# Patient Record
Sex: Male | Born: 1985 | Race: Black or African American | Hispanic: No | Marital: Married | Smoking: Current every day smoker
Health system: Southern US, Community
[De-identification: ages and names within clinical notes are randomized; demographics above are authoritative.]

---
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
## 2003-02-16 IMAGING — CT CT PARANASAL SINUSES LIMITED
1 series · 10 of 12 positions shown, 13 images · non-contrast
Comparison: none

FINDINGS
CLINICAL DATA: CHRONIC SINUSITIS.
CT SINUS LTD WITHOUT CONTRAST MEDIA
4 MM COLLIMATION CORONAL NON-CONTRAST SURVEY IMAGES OF THE PARANASAL SINUSES ARE OBTAINED AT 10 MM
SPACING.  MILD BOWING OF NASAL SEPTUM TO THE RIGHT.  PARANASAL SINUSES CLEAR.  NO FLUID,
SIGNIFICANT MUCOSAL THICKENING OR OPACIFICATION SEEN.  NO FRACTURE OR BONE DESTRUCTION.
IMPRESSION
SLIGHT NASAL SEPTAL BOWING.  NO SINUS OPACIFICATION.

[Series 9465: — · axial · 0.30mm/px · z∈[-597,-507]mm · 10 of 12 slices shown, 13 images]
[im 2/12  brain]
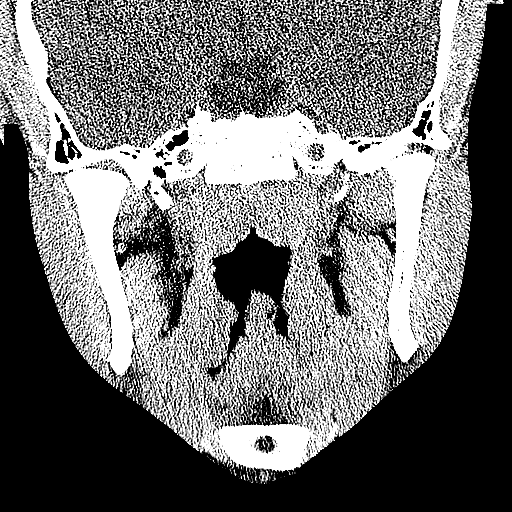
[im 2/12  bone]
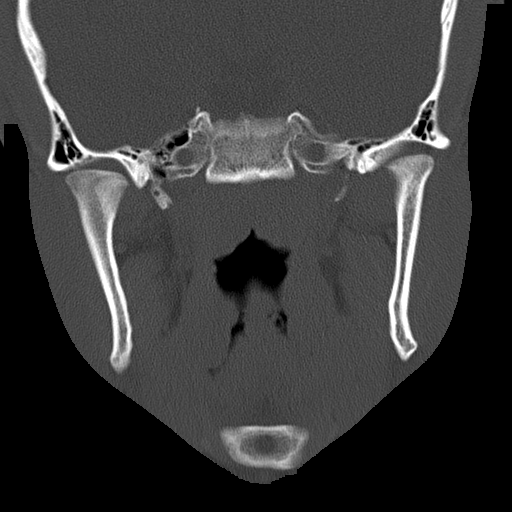
[im 3/12  bone]
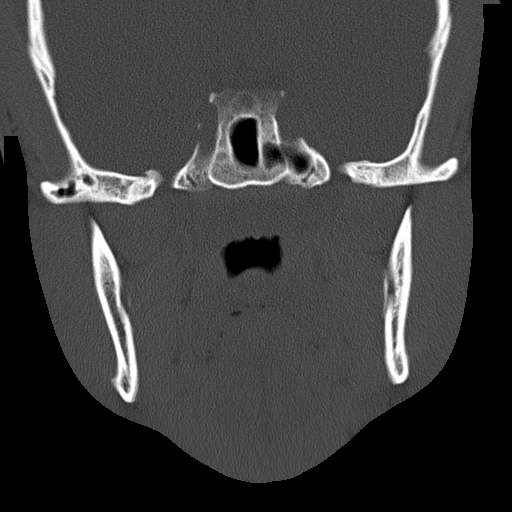
[im 4/12  bone]
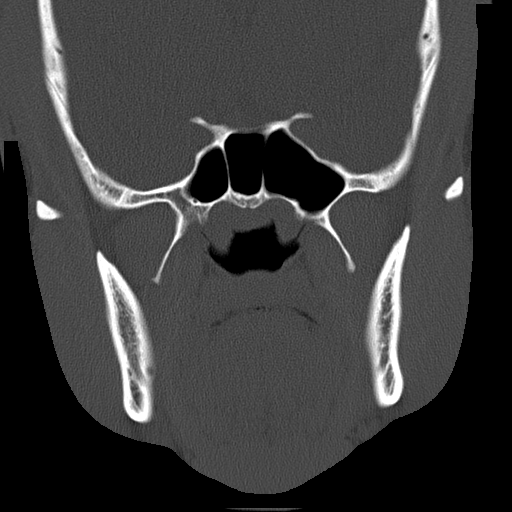
[im 5/12  bone]
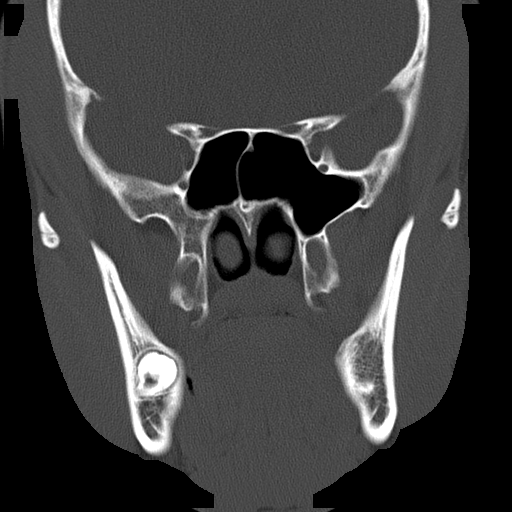
[im 6/12  brain]
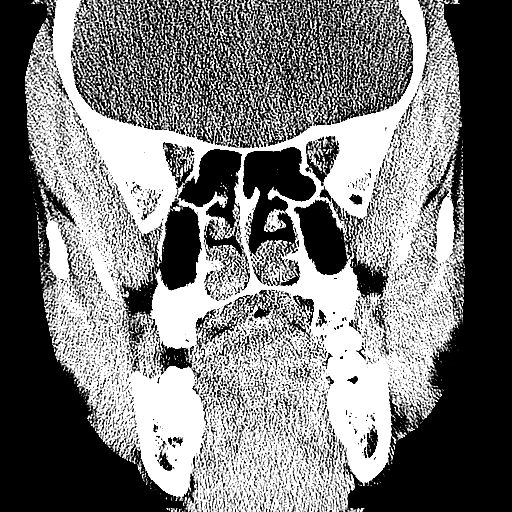
[im 6/12  bone]
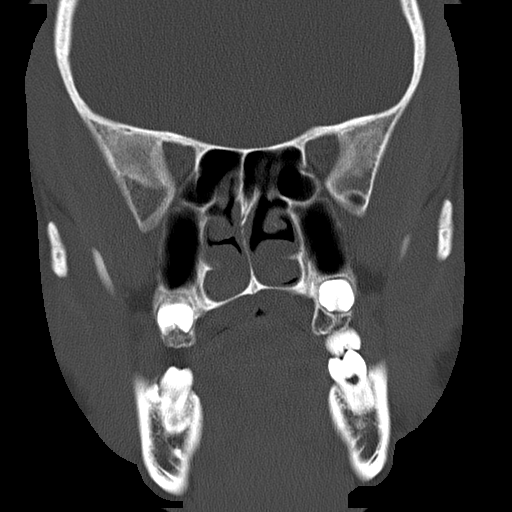
[im 7/12  bone]
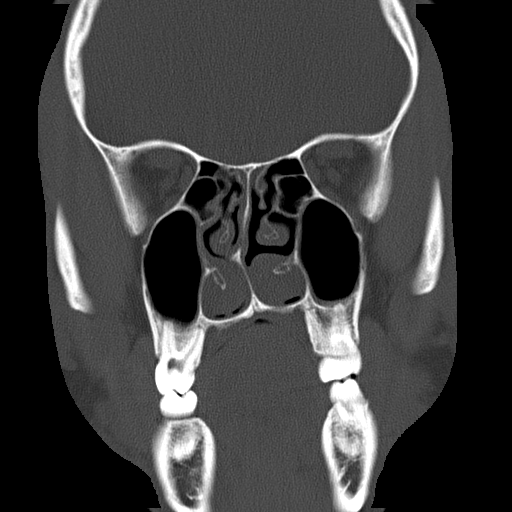
[im 8/12  bone]
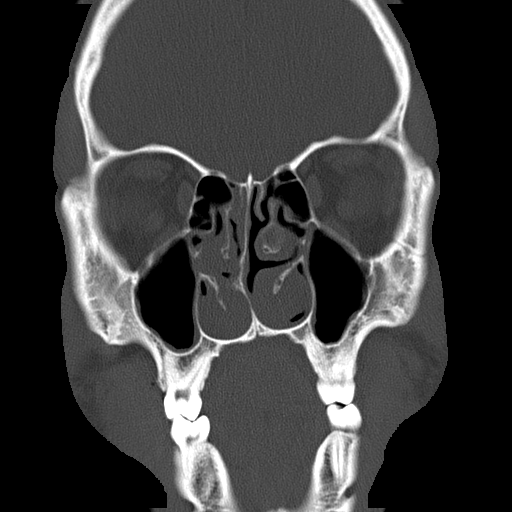
[im 9/12  bone]
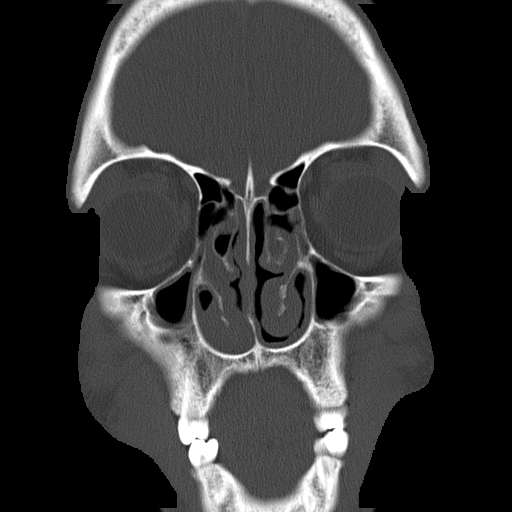
[im 10/12  brain]
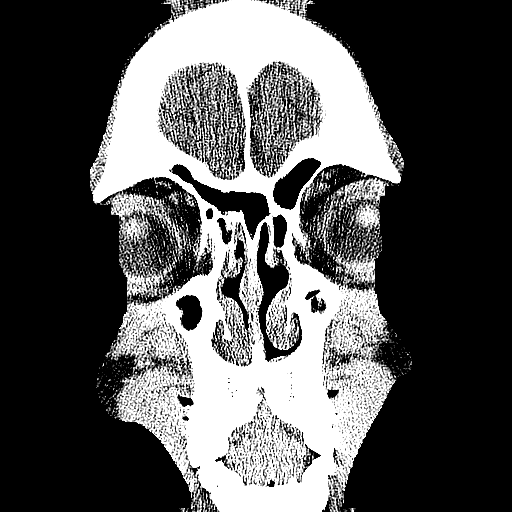
[im 10/12  bone]
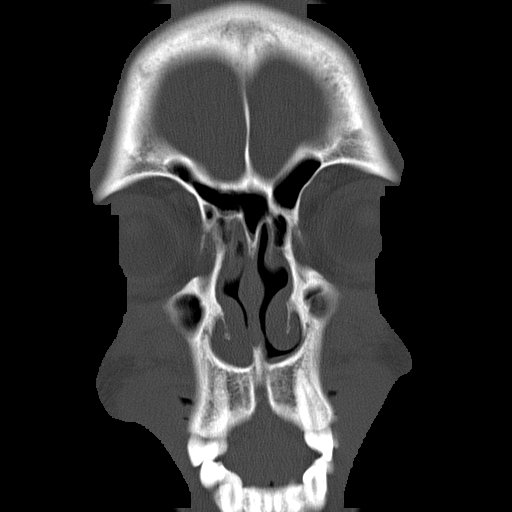
[im 11/12  bone]
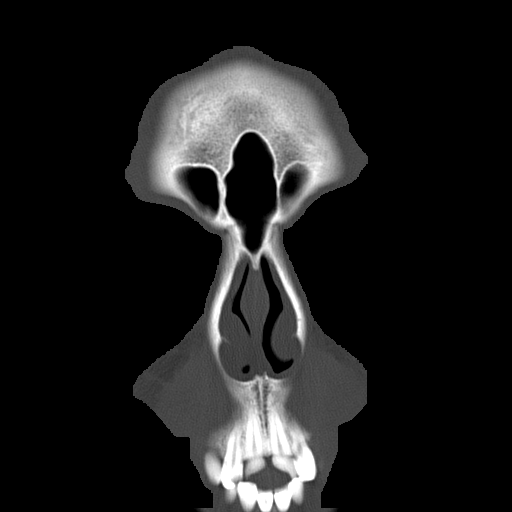

[10 of 12 positions shown; findings below may reference images not displayed]

## 2009-02-04 ENCOUNTER — Emergency Department (HOSPITAL_COMMUNITY): Admission: EM | Admit: 2009-02-04 | Discharge: 2009-02-05 | Payer: Self-pay | Admitting: Emergency Medicine

## 2009-11-19 IMAGING — CR DG CHEST 2V
2 series · 2 of 2 positions shown · non-contrast
Comparison: None

CLINICAL DATA: Chest pain, weakness, smoker

CHEST - 2 VIEW

[w chest pa]
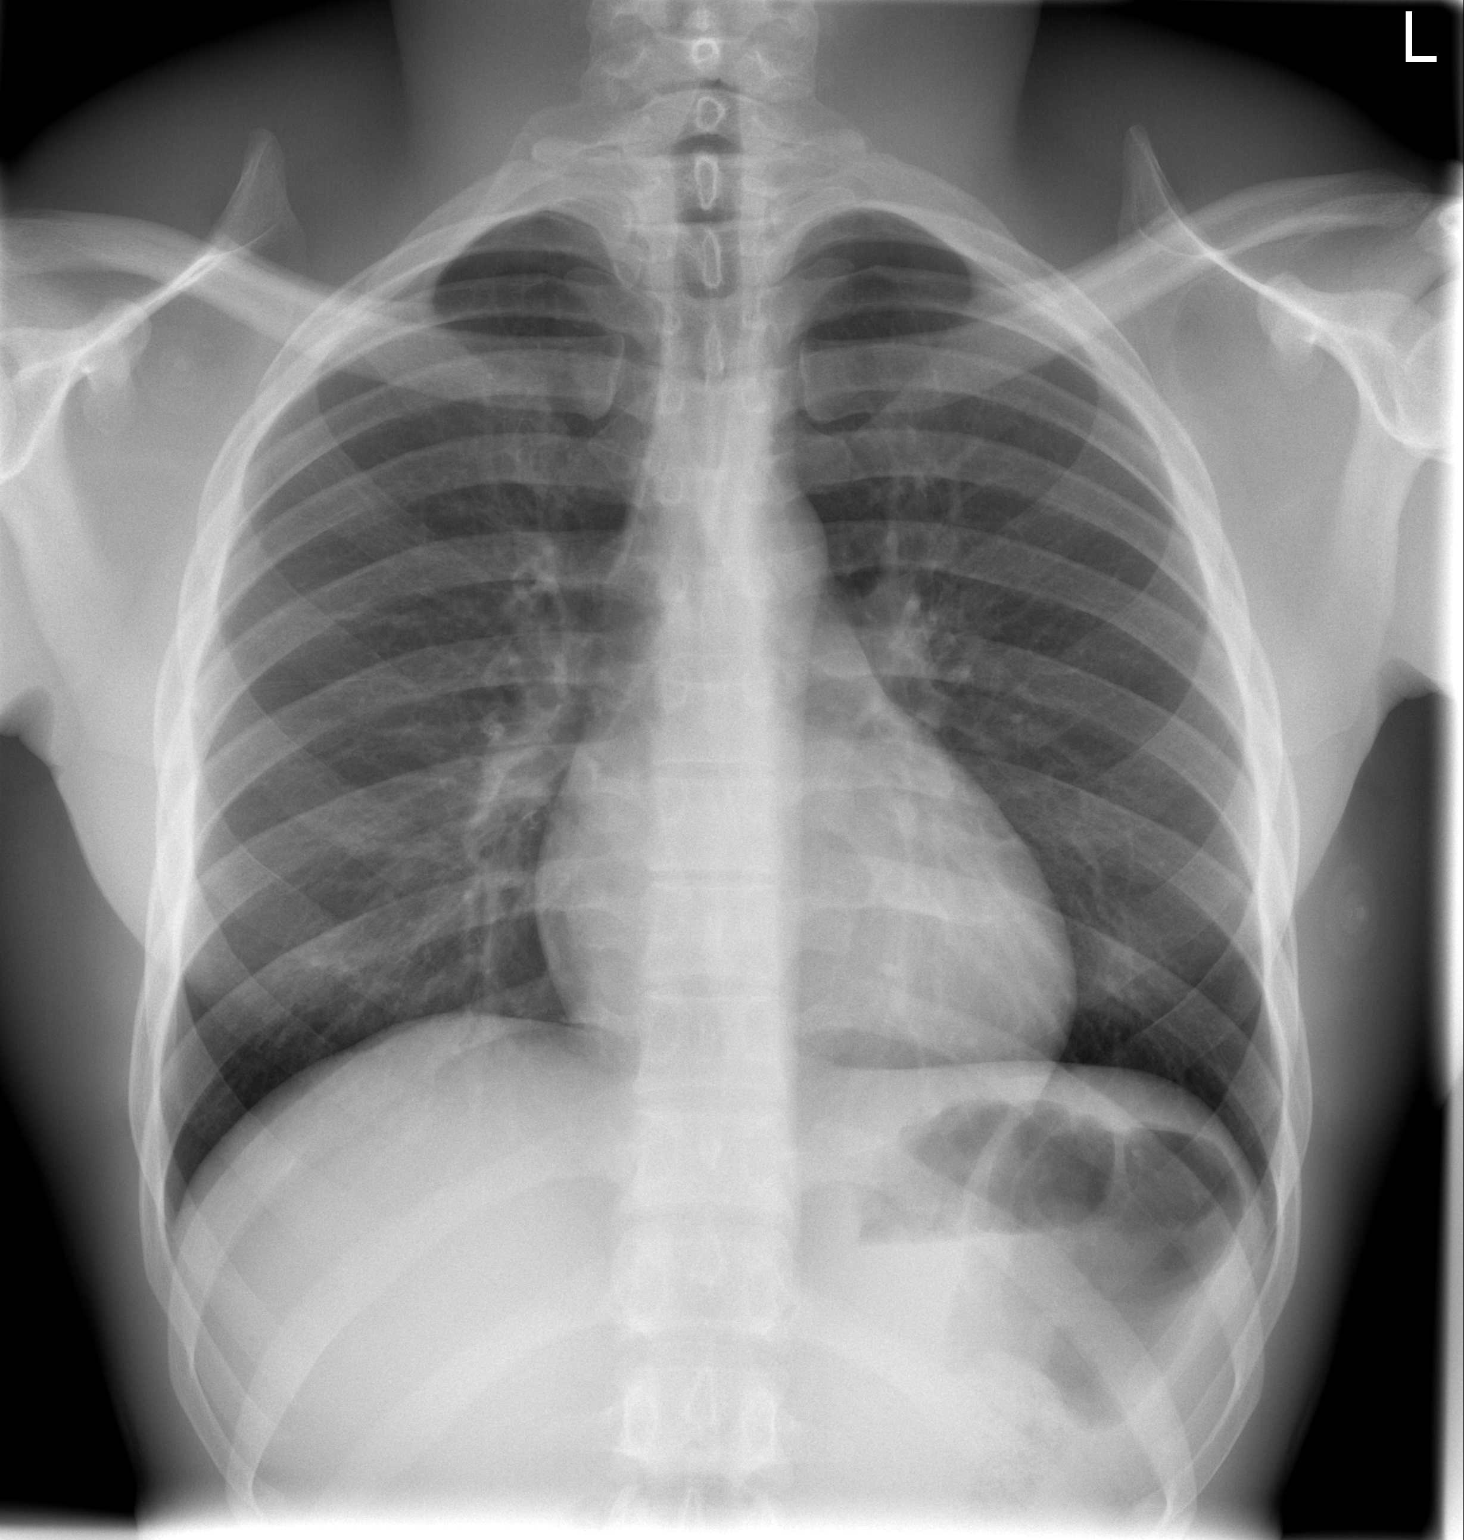

[w chest lat]
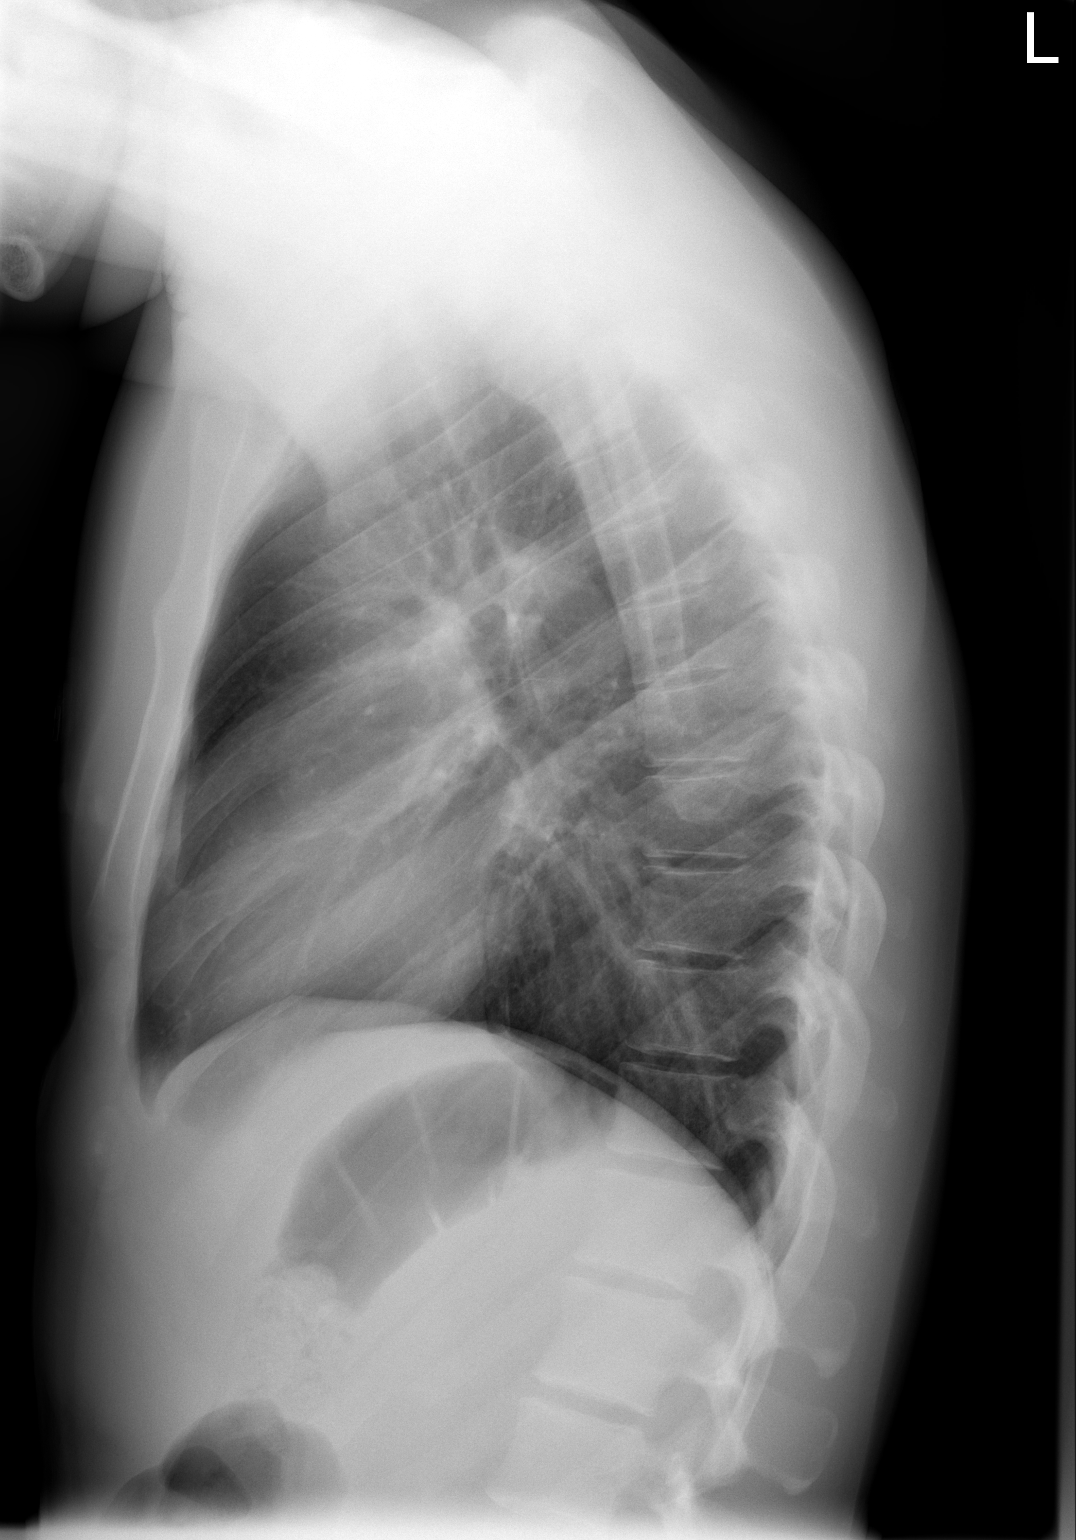

[2 of 2 positions shown; findings below may reference images not displayed]

FINDINGS: Normal heart size, mediastinal contours, and pulmonary vascularity.
Lungs clear.
Bones unremarkable.
No pneumothorax.
IMPRESSION: No acute abnormalities.

## 2011-03-02 NOTE — Procedures (Signed)
[facility]  Patient:    Bobby Hull, Bobby Hull Visit Number: [id number] MRN: [id number]          Service Type: Attending:  Kari Baars, M.D. Dictated by:   Kari Baars, M.D.                      Pulmonary Function Test Inter.  IMPRESSION 1. Spirometry is normal. Dictated by:   Kari Baars, M.D. Attending:  Kari Baars, M.D. DD:  11/07/01 TD:  11/09/01 Job: [id number]

## 2013-11-23 ENCOUNTER — Ambulatory Visit (INDEPENDENT_AMBULATORY_CARE_PROVIDER_SITE_OTHER): Payer: Managed Care, Other (non HMO) | Admitting: Nurse Practitioner

## 2013-11-23 ENCOUNTER — Encounter: Payer: Self-pay | Admitting: Nurse Practitioner

## 2013-11-23 VITALS — BP 110/66 | HR 76 | Temp 97.2°F | Ht 64.0 in | Wt 146.0 lb

## 2013-11-23 DIAGNOSIS — F902 Attention-deficit hyperactivity disorder, combined type: Secondary | ICD-10-CM

## 2013-11-23 DIAGNOSIS — F909 Attention-deficit hyperactivity disorder, unspecified type: Secondary | ICD-10-CM

## 2013-11-23 MED ORDER — LISDEXAMFETAMINE DIMESYLATE 30 MG PO CAPS: 30.0000 mg | ORAL_CAPSULE | ORAL | Status: DC

## 2013-11-23 NOTE — Progress Notes (Signed)
   Subjective:    Patient ID: Bobby Hull, male    DOB: 23-Apr-1986, 28 y.o.   MRN: [id number]  HPI Patient was scheduled for annual physical today  But that is not what he is actually here for. PAtient in today C/o trouble concentrating at work- Says that he cannot stay focused- He is on production based at work and since can't concentrate he is not making as much money- Supervisor told him he needed to pick up his performance or he may loose his job. Says that looking back when he was in school he stayed in trouble for talking and had trouble staying focused to get work done- he learns easily so grades were good.    Review of Systems  Constitutional: Negative.   HENT: Negative.   Respiratory: Negative.   Musculoskeletal: Negative.   Psychiatric/Behavioral: Negative.   All other systems reviewed and are negative.       Objective:   Physical Exam  Constitutional: He appears well-developed and well-nourished.  Cardiovascular: Normal rate, regular rhythm and normal heart sounds.   Pulmonary/Chest: Effort normal and breath sounds normal.  Neurological: He is alert.  Psychiatric: He has a normal mood and affect. His behavior is normal. Judgment and thought content normal.   BP 110/66  Pulse 76  Temp(Src) 97.2 F (36.2 C) (Oral)  Ht 5\' 4"  (1.626 m)  Wt 146 lb (66.225 kg)  BMI 25.05 kg/m2        Assessment & Plan:   1. ADHD (attention deficit hyperactivity disorder), combined type    Meds ordered this encounter  Medications  . lisdexamfetamine (VYVANSE) 30 MG capsule    Sig: Take 1 capsule (30 mg total) by mouth every morning.    Dispense:  30 capsule    Refill:  0    Order Specific Question:  Supervising Provider    Answer:  Ernestina PennaMOORE, DONALD W [[id number]]   Stress management Follow up in 1 month Mary-Margaret Daphine DeutscherMartin, FNP

## 2013-11-23 NOTE — Patient Instructions (Signed)
Attention Deficit Hyperactivity Disorder Attention deficit hyperactivity disorder (ADHD) is a problem with behavior issues based on the way the brain functions (neurobehavioral disorder). It is a common reason for behavior and academic problems in school. SYMPTOMS  There are 3 types of ADHD. The 3 types and some of the symptoms include:  Inattentive  Gets bored or distracted easily.  Loses or forgets things. Forgets to hand in homework.  Has trouble organizing or completing tasks.  Difficulty staying on task.  An inability to organize daily tasks and school work.  Leaving projects, chores, or homework unfinished.  Trouble paying attention or responding to details. Careless mistakes.  Difficulty following directions. Often seems like is not listening.  Dislikes activities that require sustained attention (like chores or homework).  Hyperactive-impulsive  Feels like it is impossible to sit still or stay in a seat. Fidgeting with hands and feet.  Trouble waiting turn.  Talking too much or out of turn. Interruptive.  Speaks or acts impulsively.  Aggressive, disruptive behavior.  Constantly busy or on the go, noisy.  Often leaves seat when they are expected to remain seated.  Often runs or climbs where it is not appropriate, or feels very restless.  Combined  Has symptoms of both of the above. Often children with ADHD feel discouraged about themselves and with school. They often perform well below their abilities in school. As children get older, the excess motor activities can calm down, but the problems with paying attention and staying organized persist. Most children do not outgrow ADHD but with good treatment can learn to cope with the symptoms. DIAGNOSIS  When ADHD is suspected, the diagnosis should be made by professionals trained in ADHD. This professional will collect information about the individual suspected of having ADHD. Information must be collected from  various settings where the person lives, works, or attends school.  Diagnosis will include:  Confirming symptoms began in childhood.  Ruling out other reasons for the child's behavior.  The health care providers will check with the child's school and check their medical records.  They will talk to teachers and parents.  Behavior rating scales for the child will be filled out by those dealing with the child on a daily basis. A diagnosis is made only after all information has been considered. TREATMENT  Treatment usually includes behavioral treatment, tutoring or extra support in school, and stimulant medicines. Because of the way a person's brain works with ADHD, these medicines decrease impulsivity and hyperactivity and increase attention. This is different than how they would work in a person who does not have ADHD. Other medicines used include antidepressants and certain blood pressure medicines. Most experts agree that treatment for ADHD should address all aspects of the person's functioning. Along with medicines, treatment should include structured classroom management at school. Parents should reward good behavior, provide constant discipline, and limit-setting. Tutoring should be available for the child as needed. ADHD is a life-long condition. If untreated, the disorder can have long-term serious effects into adolescence and adulthood. HOME CARE INSTRUCTIONS   Often with ADHD there is a lot of frustration among family members dealing with the condition. Blame and anger are also feelings that are common. In many cases, because the problem affects the family as a whole, the entire family may need help. A therapist can help the family find better ways to handle the disruptive behaviors of the person with ADHD and promote change. If the person with ADHD is young, most of the therapist's work   is with the parents. Parents will learn techniques for coping with and improving their child's  behavior. Sometimes only the child with the ADHD needs counseling. Your health care providers can help you make these decisions.  Children with ADHD may need help learning how to organize. Some helpful tips include:  Keep routines the same every day from wake-up time to bedtime. Schedule all activities, including homework and playtime. Keep the schedule in a place where the person with ADHD will often see it. Mark schedule changes as far in advance as possible.  Schedule outdoor and indoor recreation.  Have a place for everything and keep everything in its place. This includes clothing, backpacks, and school supplies.  Encourage writing down assignments and bringing home needed books. Work with your child's teachers for assistance in organizing school work.  Offer your child a well-balanced diet. Breakfast that includes a balance of whole grains, protein and, fruits or vegetables is especially important for school performance. Children should avoid drinks with caffeine including:  Soft drinks.  Coffee.  Tea.  However, some older children (adolescents) may find these drinks helpful in improving their attention. Because it can also be common for adolescents with ADHD to become addicted to caffeine, talk with your health care provider about what is a safe amount of caffeine intake for your child.  Children with ADHD need consistent rules that they can understand and follow. If rules are followed, give small rewards. Children with ADHD often receive, and expect, criticism. Look for good behavior and praise it. Set realistic goals. Give clear instructions. Look for activities that can foster success and self-esteem. Make time for pleasant activities with your child. Give lots of affection.  Parents are their children's greatest advocates. Learn as much as possible about ADHD. This helps you become a stronger and better advocate for your child. It also helps you educate your child's teachers and  instructors if they feel inadequate in these areas. Parent support groups are often helpful. A national group with local chapters is called Children and Adults with Attention Deficit Hyperactivity Disorder (CHADD). SEEK MEDICAL CARE IF:  Your child has repeated muscle twitches, cough or speech outbursts.  Your child has sleep problems.  Your child has a marked loss of appetite.  Your child develops depression.  Your child has new or worsening behavioral problems.  Your child develops dizziness.  Your child has a racing heart.  Your child has stomach pains.  Your child develops headaches. SEEK IMMEDIATE MEDICAL CARE IF:  Your child has been diagnosed with depression or anxiety and the symptoms seem to be getting worse.  Your child has been depressed and suddenly appears to have increased energy or motivation.  You are worried that your child is having a bad reaction to a medication he or she is taking for ADHD. Document Released: 09/21/2002 Document Revised: 07/22/2013 Document Reviewed: 06/08/2013 ExitCare Patient Information 2014 [employer].  

## 2013-12-21 ENCOUNTER — Encounter: Payer: Self-pay | Admitting: Nurse Practitioner

## 2013-12-21 ENCOUNTER — Ambulatory Visit (INDEPENDENT_AMBULATORY_CARE_PROVIDER_SITE_OTHER): Payer: Managed Care, Other (non HMO) | Admitting: Nurse Practitioner

## 2013-12-21 VITALS — BP 104/67 | HR 87 | Temp 97.2°F | Ht 64.0 in | Wt 142.0 lb

## 2013-12-21 DIAGNOSIS — F909 Attention-deficit hyperactivity disorder, unspecified type: Secondary | ICD-10-CM | POA: Insufficient documentation

## 2013-12-21 DIAGNOSIS — F988 Other specified behavioral and emotional disorders with onset usually occurring in childhood and adolescence: Secondary | ICD-10-CM

## 2013-12-21 MED ORDER — METHYLPHENIDATE HCL ER (OSM) 54 MG PO TBCR: 54.0000 mg | EXTENDED_RELEASE_TABLET | ORAL | Status: DC

## 2013-12-21 NOTE — Patient Instructions (Signed)
Stress Management Stress is a state of physical or mental tension that often results from changes in your life or normal routine. Some common causes of stress are:  Death of a loved one.  Injuries or severe illnesses.  Getting fired or changing jobs.  Moving into a new home. Other causes may be:  Sexual problems.  Business or financial losses.  Taking on a large debt.  Regular conflict with someone at home or at work.  Constant tiredness from lack of sleep. It is not just bad things that are stressful. It may be stressful to:  Win the lottery.  Get married.  Buy a new car. The amount of stress that can be easily tolerated varies from person to person. Changes generally cause stress, regardless of the types of change. Too much stress can affect your health. It may lead to physical or emotional problems. Too little stress (boredom) may also become stressful. SUGGESTIONS TO REDUCE STRESS:  Talk things over with your family and friends. It often is helpful to share your concerns and worries. If you feel your problem is serious, you may want to get help from a professional counselor.  Consider your problems one at a time instead of lumping them all together. Trying to take care of everything at once may seem impossible. List all the things you need to do and then start with the most important one. Set a goal to accomplish 2 or 3 things each day. If you expect to do too many in a single day you will naturally fail, causing you to feel even more stressed.  Do not use alcohol or drugs to relieve stress. Although you may feel better for a short time, they do not remove the problems that caused the stress. They can also be habit forming.  Exercise regularly - at least 3 times per week. Physical exercise can help to relieve that "uptight" feeling and will relax you.  The shortest distance between despair and hope is often a good night's sleep.  Go to bed and get up on time allowing  yourself time for appointments without being rushed.  Take a short "time-out" period from any stressful situation that occurs during the day. Close your eyes and take some deep breaths. Starting with the muscles in your face, tense them, hold it for a few seconds, then relax. Repeat this with the muscles in your neck, shoulders, hand, stomach, back and legs.  Take good care of yourself. Eat a balanced diet and get plenty of rest.  Schedule time for having fun. Take a break from your daily routine to relax. HOME CARE INSTRUCTIONS   Call if you feel overwhelmed by your problems and feel you can no longer manage them on your own.  Return immediately if you feel like hurting yourself or someone else. Document Released: 03/27/2001 Document Revised: 12/24/2011 Document Reviewed: 05/26/2013 ExitCare Patient Information 2014 [employer].  

## 2013-12-21 NOTE — Progress Notes (Signed)
   Subjective:    Patient ID: Bobby Hull, male    DOB: Jun 06, 1986, 28 y.o.   MRN: [id number]  HPI Patient in today for ADD follow-up. He was diagnosed with this at last visit and was started on vyvanse 30 mg daily- He says he cannot tell a difference at all n his ability to concentrate Only affect he has now is trouble sleeping.     Review of Systems  Constitutional: Negative.   HENT: Negative.   Respiratory: Negative.   Cardiovascular: Negative.   Genitourinary: Negative.   Musculoskeletal: Negative.   Psychiatric/Behavioral: Negative.   All other systems reviewed and are negative.       Objective:   Physical Exam  Constitutional: He is oriented to person, place, and time. He appears well-developed and well-nourished.  Cardiovascular: Normal rate and normal heart sounds.   Pulmonary/Chest: Effort normal and breath sounds normal.  Neurological: He is alert and oriented to person, place, and time.  Psychiatric: He has a normal mood and affect. His behavior is normal. Judgment and thought content normal.   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 methylphenidate (CONCERTA) 54 MG CR tablet    Sig: Take 1 tablet (54 mg total) by mouth every morning.    Dispense:  30 tablet    Refill:  0    Order Specific Question:  Supervising Provider    Answer:  Ernestina PennaMOORE, DONALD W [[id number]]   Stopped vyvanse  Changed to concerta STress management follow up in 1 month  Mary-Margaret Daphine DeutscherMartin, [place]NP

## 2014-02-01 ENCOUNTER — Ambulatory Visit: Payer: Managed Care, Other (non HMO) | Admitting: Nurse Practitioner

## 2014-02-17 ENCOUNTER — Telehealth: Payer: Self-pay | Admitting: Nurse Practitioner

## 2014-02-17 DIAGNOSIS — F988 Other specified behavioral and emotional disorders with onset usually occurring in childhood and adolescence: Secondary | ICD-10-CM

## 2014-02-17 MED ORDER — METHYLPHENIDATE HCL ER (OSM) 54 MG PO TBCR: 54.0000 mg | EXTENDED_RELEASE_TABLET | ORAL | Status: DC

## 2014-02-17 NOTE — Telephone Encounter (Signed)
rx ready for pickup 

## 2014-02-18 NOTE — Telephone Encounter (Signed)
Patient aware to pick up at front.Marland Kitchen.Marland Kitchen..Marland Kitchen

## 2014-03-04 ENCOUNTER — Ambulatory Visit (INDEPENDENT_AMBULATORY_CARE_PROVIDER_SITE_OTHER): Payer: Managed Care, Other (non HMO) | Admitting: Nurse Practitioner

## 2014-03-04 ENCOUNTER — Encounter: Payer: Self-pay | Admitting: Nurse Practitioner

## 2014-03-04 VITALS — BP 105/67 | HR 67 | Temp 98.3°F | Ht 64.0 in | Wt 143.0 lb

## 2014-03-04 DIAGNOSIS — F909 Attention-deficit hyperactivity disorder, unspecified type: Secondary | ICD-10-CM

## 2014-03-04 MED ORDER — AMPHETAMINE-DEXTROAMPHET ER 30 MG PO CP24: 30.0000 mg | ORAL_CAPSULE | ORAL | Status: DC

## 2014-03-04 NOTE — Patient Instructions (Signed)
Attention Deficit Hyperactivity Disorder Attention deficit hyperactivity disorder (ADHD) is a problem with behavior issues based on the way the brain functions (neurobehavioral disorder). It is a common reason for behavior and academic problems in school. SYMPTOMS  There are 3 types of ADHD. The 3 types and some of the symptoms include:  Inattentive  Gets bored or distracted easily.  Loses or forgets things. Forgets to hand in homework.  Has trouble organizing or completing tasks.  Difficulty staying on task.  An inability to organize daily tasks and school work.  Leaving projects, chores, or homework unfinished.  Trouble paying attention or responding to details. Careless mistakes.  Difficulty following directions. Often seems like is not listening.  Dislikes activities that require sustained attention (like chores or homework).  Hyperactive-impulsive  Feels like it is impossible to sit still or stay in a seat. Fidgeting with hands and feet.  Trouble waiting turn.  Talking too much or out of turn. Interruptive.  Speaks or acts impulsively.  Aggressive, disruptive behavior.  Constantly busy or on the go, noisy.  Often leaves seat when they are expected to remain seated.  Often runs or climbs where it is not appropriate, or feels very restless.  Combined  Has symptoms of both of the above. Often children with ADHD feel discouraged about themselves and with school. They often perform well below their abilities in school. As children get older, the excess motor activities can calm down, but the problems with paying attention and staying organized persist. Most children do not outgrow ADHD but with good treatment can learn to cope with the symptoms. DIAGNOSIS  When ADHD is suspected, the diagnosis should be made by professionals trained in ADHD. This professional will collect information about the individual suspected of having ADHD. Information must be collected from  various settings where the person lives, works, or attends school.  Diagnosis will include:  Confirming symptoms began in childhood.  Ruling out other reasons for the child's behavior.  The health care providers will check with the child's school and check their medical records.  They will talk to teachers and parents.  Behavior rating scales for the child will be filled out by those dealing with the child on a daily basis. A diagnosis is made only after all information has been considered. TREATMENT  Treatment usually includes behavioral treatment, tutoring or extra support in school, and stimulant medicines. Because of the way a person's brain works with ADHD, these medicines decrease impulsivity and hyperactivity and increase attention. This is different than how they would work in a person who does not have ADHD. Other medicines used include antidepressants and certain blood pressure medicines. Most experts agree that treatment for ADHD should address all aspects of the person's functioning. Along with medicines, treatment should include structured classroom management at school. Parents should reward good behavior, provide constant discipline, and limit-setting. Tutoring should be available for the child as needed. ADHD is a life-long condition. If untreated, the disorder can have long-term serious effects into adolescence and adulthood. HOME CARE INSTRUCTIONS   Often with ADHD there is a lot of frustration among family members dealing with the condition. Blame and anger are also feelings that are common. In many cases, because the problem affects the family as a whole, the entire family may need help. A therapist can help the family find better ways to handle the disruptive behaviors of the person with ADHD and promote change. If the person with ADHD is young, most of the therapist's work   is with the parents. Parents will learn techniques for coping with and improving their child's  behavior. Sometimes only the child with the ADHD needs counseling. Your health care providers can help you make these decisions.  Children with ADHD may need help learning how to organize. Some helpful tips include:  Keep routines the same every day from wake-up time to bedtime. Schedule all activities, including homework and playtime. Keep the schedule in a place where the person with ADHD will often see it. Mark schedule changes as far in advance as possible.  Schedule outdoor and indoor recreation.  Have a place for everything and keep everything in its place. This includes clothing, backpacks, and school supplies.  Encourage writing down assignments and bringing home needed books. Work with your child's teachers for assistance in organizing school work.  Offer your child a well-balanced diet. Breakfast that includes a balance of whole grains, protein and, fruits or vegetables is especially important for school performance. Children should avoid drinks with caffeine including:  Soft drinks.  Coffee.  Tea.  However, some older children (adolescents) may find these drinks helpful in improving their attention. Because it can also be common for adolescents with ADHD to become addicted to caffeine, talk with your health care provider about what is a safe amount of caffeine intake for your child.  Children with ADHD need consistent rules that they can understand and follow. If rules are followed, give small rewards. Children with ADHD often receive, and expect, criticism. Look for good behavior and praise it. Set realistic goals. Give clear instructions. Look for activities that can foster success and self-esteem. Make time for pleasant activities with your child. Give lots of affection.  Parents are their children's greatest advocates. Learn as much as possible about ADHD. This helps you become a stronger and better advocate for your child. It also helps you educate your child's teachers and  instructors if they feel inadequate in these areas. Parent support groups are often helpful. A national group with local chapters is called Children and Adults with Attention Deficit Hyperactivity Disorder (CHADD). SEEK MEDICAL CARE IF:  Your child has repeated muscle twitches, cough or speech outbursts.  Your child has sleep problems.  Your child has a marked loss of appetite.  Your child develops depression.  Your child has new or worsening behavioral problems.  Your child develops dizziness.  Your child has a racing heart.  Your child has stomach pains.  Your child develops headaches. SEEK IMMEDIATE MEDICAL CARE IF:  Your child has been diagnosed with depression or anxiety and the symptoms seem to be getting worse.  Your child has been depressed and suddenly appears to have increased energy or motivation.  You are worried that your child is having a bad reaction to a medication he or she is taking for ADHD. Document Released: 09/21/2002 Document Revised: 07/22/2013 Document Reviewed: 06/08/2013 ExitCare Patient Information 2014 [employer].  

## 2014-03-04 NOTE — Progress Notes (Signed)
   Subjective:    Patient ID: Bobby Hull, male    DOB: [birth year], 28 y.o.   MRN: [id number]  HPI Patient in today for follow up of adult ADHd- He is currently on concerta which works well for his concentration but causes him to be nauseated all the time- Was on vyvanse in the past but that did not help.    Review of Systems  Constitutional: Negative.   HENT: Negative.   Respiratory: Negative.   Cardiovascular: Negative.   Genitourinary: Negative.   Hematological: Negative.   Psychiatric/Behavioral: Negative.   All other systems reviewed and are negative.      Objective:   Physical Exam  Constitutional: He is oriented to person, place, and time. He appears well-developed and well-nourished.  Cardiovascular: Normal rate, regular rhythm and normal heart sounds.   Pulmonary/Chest: Effort normal and breath sounds normal.  Neurological: He is alert and oriented to person, place, and time.  Skin: Skin is warm and dry.  Psychiatric: He has a normal mood and affect. His behavior is normal. Judgment and thought content normal.    BP 105/67  Pulse 67  Temp(Src) 98.3 F (36.8 C) (Oral)  Ht 5\' 4"  (1.626 m)  Wt 143 lb (64.864 kg)  BMI 24.53 kg/m2       Assessment & Plan:   1. Adult ADHD    Meds ordered this encounter  Medications  . amphetamine-dextroamphetamine (ADDERALL XR) 30 MG 24 hr capsule    Sig: Take 1 capsule (30 mg total) by mouth every morning.    Dispense:  30 capsule    Refill:  0    Order Specific Question:  Supervising Provider    Answer:  Ernestina PennaMOORE, DONALD W [[id number]]   Stress management Follow up in 1 month  Mary-Margaret Daphine DeutscherMartin, FNP

## 2014-04-01 ENCOUNTER — Telehealth: Payer: Self-pay | Admitting: Nurse Practitioner

## 2014-04-01 MED ORDER — AMPHETAMINE-DEXTROAMPHET ER 30 MG PO CP24: 30.0000 mg | ORAL_CAPSULE | ORAL | Status: DC

## 2014-04-01 NOTE — Telephone Encounter (Signed)
rx ready for pickup 

## 2014-04-01 NOTE — Telephone Encounter (Signed)
Script is available to be picked up at front desk with photo ID. I was unable to reach patient by phone and a voicemail was not available.

## 2014-04-02 MED ORDER — AMPHETAMINE-DEXTROAMPHET ER 30 MG PO CP24: 30.0000 mg | ORAL_CAPSULE | ORAL | Status: DC

## 2014-04-02 NOTE — Telephone Encounter (Signed)
rx ready for pickup 

## 2014-05-21 ENCOUNTER — Telehealth: Payer: Self-pay | Admitting: Nurse Practitioner

## 2014-05-21 MED ORDER — AMPHETAMINE-DEXTROAMPHET ER 30 MG PO CP24: 30.0000 mg | ORAL_CAPSULE | ORAL | Status: DC

## 2014-05-21 NOTE — Telephone Encounter (Signed)
rx ready for pickup 

## 2014-05-22 NOTE — Telephone Encounter (Signed)
Patient aware to pick up 

## 2015-07-01 ENCOUNTER — Ambulatory Visit (INDEPENDENT_AMBULATORY_CARE_PROVIDER_SITE_OTHER): Payer: Managed Care, Other (non HMO) | Admitting: Family

## 2015-07-01 ENCOUNTER — Encounter: Payer: Self-pay | Admitting: Family

## 2015-07-01 VITALS — BP 112/82 | HR 78 | Temp 97.6°F | Ht 64.0 in | Wt 148.6 lb

## 2015-07-01 DIAGNOSIS — J0100 Acute maxillary sinusitis, unspecified: Secondary | ICD-10-CM

## 2015-07-01 MED ORDER — AMOXICILLIN-POT CLAVULANATE 875-125 MG PO TABS: 1.0000 | ORAL_TABLET | Freq: Two times a day (BID) | ORAL | Status: DC

## 2015-07-01 NOTE — Progress Notes (Signed)
   Subjective:    Patient ID: Bobby Hull, male    DOB: Jan 28, 1986, 29 y.o.   MRN: [id number]  Sinus Problem This is a new problem. The current episode started in the past 7 days. The problem has been waxing and waning since onset. There has been no fever. His pain is at a severity of 7/10. The pain is moderate. Associated symptoms include congestion, coughing, ear pain, headaches, sinus pressure and sneezing. Pertinent negatives include no hoarse voice, shortness of breath or sore throat. Past treatments include oral decongestants. The treatment provided mild relief.      Review of Systems  Constitutional: Negative.   HENT: Positive for congestion, ear pain, sinus pressure and sneezing. Negative for hoarse voice and sore throat.   Respiratory: Positive for cough. Negative for shortness of breath.   Cardiovascular: Negative.   Gastrointestinal: Negative.   Endocrine: Negative.   Genitourinary: Negative.   Musculoskeletal: Negative.   Neurological: Positive for headaches.  Hematological: Negative.   Psychiatric/Behavioral: Negative.   All other systems reviewed and are negative.      Objective:   Physical Exam  Constitutional: He is oriented to person, place, and time. He appears well-developed and well-nourished. No distress.  HENT:  Head: Normocephalic.  Right Ear: External ear normal.  Left Ear: External ear normal. Tympanic membrane is erythematous. A middle ear effusion is present.  Nose: Right sinus exhibits maxillary sinus tenderness. Left sinus exhibits maxillary sinus tenderness.  Nasal passage erythemas with mild swelling  Oropharynx erythemas   Eyes: Pupils are equal, round, and reactive to light. Right eye exhibits no discharge. Left eye exhibits no discharge.  Neck: Normal range of motion. Neck supple. No thyromegaly present.  Cardiovascular: Normal rate, regular rhythm, normal heart sounds and intact distal pulses.   No murmur heard. Pulmonary/Chest: Effort  normal and breath sounds normal. No respiratory distress. He has no wheezes.  Abdominal: Soft. Bowel sounds are normal. He exhibits no distension. There is no tenderness.  Musculoskeletal: Normal range of motion. He exhibits no edema or tenderness.  Neurological: He is alert and oriented to person, place, and time. He has normal reflexes. No cranial nerve deficit.  Skin: Skin is warm and dry. No rash noted. No erythema.  Psychiatric: He has a normal mood and affect. His behavior is normal. Judgment and thought content normal.  Vitals reviewed.    BP 112/82 mmHg  Pulse 78  Temp(Src) 97.6 F (36.4 C) (Oral)  Ht  (1.626 m)  Wt 148 lb 9.6 oz (67.405 kg)  BMI 25.49 kg/m2      Assessment & Plan:  1. Acute maxillary sinusitis, recurrence not specified -- Take meds as prescribed - Use a cool mist humidifier  -Use saline nose sprays frequently -Saline irrigations of the nose can be very helpful if done frequently.  * 4X daily for 1 week*  * Use of a nettie pot can be helpful with this. Follow directions with this* -Force fluids -For any cough or congestion  Use plain Mucinex- regular strength or max strength is fine   * Children- consult with Pharmacist for dosing -For fever or aces or pains- take tylenol or ibuprofen appropriate for age and weight.  * for fevers greater than 101 orally you may alternate ibuprofen and tylenol every  3 hours. -Throat lozenges if help - amoxicillin-clavulanate (AUGMENTIN) 875-125 MG per tablet; Take 1 tablet by mouth 2 (two) times daily.  Dispense: 14 tablet; Refill: 0  Jannifer Rodney, FNP

## 2015-07-01 NOTE — Patient Instructions (Signed)
Sinusitis Sinusitis is redness, soreness, and inflammation of the paranasal sinuses. Paranasal sinuses are air pockets within the bones of your face (beneath the eyes, the middle of the forehead, or above the eyes). In healthy paranasal sinuses, mucus is able to drain out, and air is able to circulate through them by way of your nose. However, when your paranasal sinuses are inflamed, mucus and air can become trapped. This can allow bacteria and other germs to grow and cause infection. Sinusitis can develop quickly and last only a short time (acute) or continue over a long period (chronic). Sinusitis that lasts for more than 12 weeks is considered chronic.  CAUSES  Causes of sinusitis include:  Allergies.  Structural abnormalities, such as displacement of the cartilage that separates your nostrils (deviated septum), which can decrease the air flow through your nose and sinuses and affect sinus drainage.  Functional abnormalities, such as when the small hairs (cilia) that line your sinuses and help remove mucus do not work properly or are not present. SIGNS AND SYMPTOMS  Symptoms of acute and chronic sinusitis are the same. The primary symptoms are pain and pressure around the affected sinuses. Other symptoms include:  Upper toothache.  Earache.  Headache.  Bad breath.  Decreased sense of smell and taste.  A cough, which worsens when you are lying flat.  Fatigue.  Fever.  Thick drainage from your nose, which often is green and may contain pus (purulent).  Swelling and warmth over the affected sinuses. DIAGNOSIS  Your health care provider will perform a physical exam. During the exam, your health care provider may:  Look in your nose for signs of abnormal growths in your nostrils (nasal polyps).  Tap over the affected sinus to check for signs of infection.  View the inside of your sinuses (endoscopy) using an imaging device that has a light attached (endoscope). If your health  care provider suspects that you have chronic sinusitis, one or more of the following tests may be recommended:  Allergy tests.  Nasal culture. A sample of mucus is taken from your nose, sent to a lab, and screened for bacteria.  Nasal cytology. A sample of mucus is taken from your nose and examined by your health care provider to determine if your sinusitis is related to an allergy. TREATMENT  Most cases of acute sinusitis are related to a viral infection and will resolve on their own within 10 days. Sometimes medicines are prescribed to help relieve symptoms (pain medicine, decongestants, nasal steroid sprays, or saline sprays).  However, for sinusitis related to a bacterial infection, your health care provider will prescribe antibiotic medicines. These are medicines that will help kill the bacteria causing the infection.  Rarely, sinusitis is caused by a fungal infection. In theses cases, your health care provider will prescribe antifungal medicine. For some cases of chronic sinusitis, surgery is needed. Generally, these are cases in which sinusitis recurs more than 3 times per year, despite other treatments. HOME CARE INSTRUCTIONS   Drink plenty of water. Water helps thin the mucus so your sinuses can drain more easily.  Use a humidifier.  Inhale steam 3 to 4 times a day (for example, sit in the bathroom with the shower running).  Apply a warm, moist washcloth to your face 3 to 4 times a day, or as directed by your health care provider.  Use saline nasal sprays to help moisten and clean your sinuses.  Take medicines only as directed by your health care provider.    If you were prescribed either an antibiotic or antifungal medicine, finish it all even if you start to feel better. SEEK IMMEDIATE MEDICAL CARE IF:  You have increasing pain or severe headaches.  You have nausea, vomiting, or drowsiness.  You have swelling around your face.  You have vision problems.  You have a stiff  neck.  You have difficulty breathing. MAKE SURE YOU:   Understand these instructions.  Will watch your condition.  Will get help right away if you are not doing well or get worse. Document Released: 10/01/2005 Document Revised: 02/15/2014 Document Reviewed: 10/16/2011 Exit[employer] Patient Information 2015 [employer]. This information is not intended to replace advice given to you by your health care provider. Make sure you discuss any questions you have with your health care provider.  - Take meds as prescribed - Use a cool mist humidifier  -Use saline nose sprays frequently -Saline irrigations of the nose can be very helpful if done frequently.  * 4X daily for 1 week*  * Use of a nettie pot can be helpful with this. Follow directions with this* -Force fluids -For any cough or congestion  Use plain Mucinex- regular strength or max strength is fine   * Children- consult with Pharmacist for dosing -For fever or aces or pains- take tylenol or ibuprofen appropriate for age and weight.  * for fevers greater than 101 orally you may alternate ibuprofen and tylenol every  3 hours. -Throat lozenges if help   Christy Hawks, FNP  

## 2015-12-14 ENCOUNTER — Telehealth: Payer: Self-pay | Admitting: Nurse Practitioner

## 2015-12-15 NOTE — Telephone Encounter (Signed)
Pt aware [facility] note written for 12/07/15

## 2015-12-15 NOTE — Telephone Encounter (Signed)
Ok to write patient note

## 2016-04-19 ENCOUNTER — Ambulatory Visit (INDEPENDENT_AMBULATORY_CARE_PROVIDER_SITE_OTHER): Payer: Managed Care, Other (non HMO) | Admitting: Pediatrics

## 2016-04-19 ENCOUNTER — Encounter: Payer: Self-pay | Admitting: Pediatrics

## 2016-04-19 ENCOUNTER — Telehealth: Payer: Self-pay | Admitting: Nurse Practitioner

## 2016-04-19 VITALS — BP 107/75 | HR 60 | Temp 97.9°F | Ht 64.0 in | Wt 152.4 lb

## 2016-04-19 DIAGNOSIS — S01511A Laceration without foreign body of lip, initial encounter: Secondary | ICD-10-CM

## 2016-04-19 DIAGNOSIS — L089 Local infection of the skin and subcutaneous tissue, unspecified: Secondary | ICD-10-CM

## 2016-04-19 MED ORDER — AMOXICILLIN-POT CLAVULANATE 875-125 MG PO TABS: 1.0000 | ORAL_TABLET | Freq: Two times a day (BID) | ORAL | Status: AC

## 2016-04-19 NOTE — Telephone Encounter (Signed)
Pt has lip lac from injury on 7/4 Now has redness appt scheduled

## 2016-04-19 NOTE — Progress Notes (Signed)
    Subjective:    Patient ID: Bobby Hull, male    DOB: August 30, 1986, 30 y.o.   MRN: [id number]  CC: Lip Laceration   HPI: Bobby Hull is a 30 y.o. male presenting for Lip Laceration  Cut lip when daughter's head banged into his lip while going down a slide Went to ED, had absorbable sutures placed Today noticed lip started smelling bad, has discharge from just inside the lip No fevers   Dep[facility] 04/19/2016 07/01/2015  Decreased Interest 0 0  Down, Depressed, Hopeless 0 0  PHQ - 2 Score 0 0     Relevant past medical, surgical, family and social history reviewed and updated as indicated.  Interim medical history since our last visit reviewed. Allergies and medications reviewed and updated.  ROS: Per HPI unless specifically indicated above  History  Smoking status  . Current Every Day Smoker  Smokeless tobacco  . Not on file       Objective:    BP 107/75 mmHg  Pulse 60  Temp(Src) 97.9 F (36.6 C) (Oral)  Ht 5\' 4"  (1.626 m)  Wt 152 lb 6.4 oz (69.128 kg)  BMI 26.15 kg/m2  SpO2 100%  Wt Readings from Last 3 Encounters:  04/19/16 152 lb 6.4 oz (69.128 kg)  07/01/15 148 lb 9.6 oz (67.405 kg)  03/04/14 143 lb (64.864 kg)     Gen: NAD, alert, cooperative with exam, NCAT EYES: EOMI, no scleral injection or icterus ENT:  OP without erythema Skin: R upper lip with two sutures in place. Scab over sutures. Inside lip with area of white with yellow discharge. Neuro: Alert and oriented     Assessment & Plan:    Bobby Hull was seen today for lip laceration infection.  Diagnoses and all orders for this visit:  Infected lip laceration, initial encounter -     amoxicillin-clavulanate (AUGMENTIN) 875-125 MG tablet; Take 1 tablet by mouth 2 (two) times daily.    Follow up plan: Return if symptoms worsen or fail to improve.  Rex Krasarol Vincent, MD [facility] 04/19/2016, 5:32 PM

## 2019-07-21 ENCOUNTER — Other Ambulatory Visit: Payer: Self-pay

## 2019-07-21 DIAGNOSIS — Z20822 Contact with and (suspected) exposure to covid-19: Secondary | ICD-10-CM

## 2019-07-23 LAB — NOVEL CORONAVIRUS, NAA: SARS-CoV-2, NAA: NOT DETECTED

## 2019-09-08 ENCOUNTER — Other Ambulatory Visit: Payer: Self-pay

## 2019-09-08 DIAGNOSIS — Z20822 Contact with and (suspected) exposure to covid-19: Secondary | ICD-10-CM

## 2019-09-10 LAB — NOVEL CORONAVIRUS, NAA: SARS-CoV-2, NAA: NOT DETECTED

## 2019-09-11 ENCOUNTER — Telehealth: Payer: Self-pay | Admitting: *Deleted

## 2019-09-11 NOTE — Telephone Encounter (Signed)
Patient called and was given negative covid results . 

## 2023-09-26 NOTE — ED Provider Notes (Signed)
 [facility]  Emergency Department Provider Note     History   Chief Complaint Loss of Appetite and Nausea   HPI  Bobby Hull is a 37 y.o. male with complaint of having loss of appetite and nausea.  Patient states that this has been ongoing for the past couple of months.  He also lost about 40 pounds since June.    No past medical history on file.  No past surgical history on file.  Prior to Admission medications   Not on File    Allergies Patient has no known allergies.      Review of Systems  Constitutional:  Positive for weight loss.       Loss of appetite  All other systems reviewed and are negative.   As in HPI, all systems reviewed and otherwise negative.  Physical Exam    Vitals:   09/26/23 0932  BP: 144/81  Pulse: 74  Resp: 18  Temp: 36.8 C (98.2 F)  TempSrc: Oral  SpO2: 99%  Weight: 71.3 kg (157 lb 3.2 oz)  Height: 162.6 cm (5' 4)     Physical Exam  Constitutional: Patient appears well-developed and well nourished. Non toxic in appearance. HEENT: Unremarkable. Head: Atraumatic.  Eyes: Normal ocular movements. Neck: Supple with normal range of motion.  Pulmonary/Chest: Effort normal. No respiratory distress. Abdominal: Soft and non tender abdomen. Musculoskeletal: Extremities atraumatic. Neurological: Alert with no focal neurological deficit. Ambulatory with a steady gait. Skin: Warm and dry.  Nursing note and vital signs reviewed.   ED Course    Patient comfortably sitting talking with no obvious distress or discomfort.  There is no lethargy.  There is no trouble breathing there is no pain anywhere in the body.  There is no nausea currently.  He denies any diarrhea.  He denies any chest pain headache abdominal pain or back pains.  No pains in his arms or shoulders no rash.  No recent travel history.  Negative exposure to anyone sick or ill.  No prominent family history.  Denies depression.  Denies any thoughts of  hurting himself or anyone else.  Continues to work as a Psychologist, occupational.  Anorexia,    No orders of the defined types were placed in this encounter.    Medical Decision Making   I have reviewed the vital signs and the nursing notes. Labs and radiology results that were available during my care of the patient were independently reviewed by me and considered in my medical decision making.      Medical Decision Making    Differential Diagnosis: Anorexia, electrolyte abnormality, thyroid thyroid abnormality     Radiology   No orders to display    ED Clinical Impression   Final diagnoses:  None  Hypothyroidism Anorexia Proteinuria    Procedures      This record has been created using Animal nutritionist. Chart creation errors have been sought, but may not always have been located. Such creation errors do not reflect on the standard of medical care.   Maree Jonelle Lash, MD 09/26/23 [phone]

## 2023-10-29 ENCOUNTER — Encounter (INDEPENDENT_AMBULATORY_CARE_PROVIDER_SITE_OTHER): Payer: Self-pay | Admitting: *Deleted
